# Patient Record
Sex: Male | Born: 2000 | Hispanic: Yes | Marital: Single | Smoking: Former smoker
Health system: Southern US, Community
[De-identification: ages and names within clinical notes are randomized; demographics above are authoritative.]

## PROBLEM LIST (undated history)

## (undated) ENCOUNTER — Emergency Department (HOSPITAL_COMMUNITY): Admission: EM | Payer: No Typology Code available for payment source | Source: Home / Self Care

## (undated) DIAGNOSIS — R399 Unspecified symptoms and signs involving the genitourinary system: Secondary | ICD-10-CM

## (undated) HISTORY — DX: Unspecified symptoms and signs involving the genitourinary system: R39.9

---
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
## 2018-08-12 ENCOUNTER — Ambulatory Visit (INDEPENDENT_AMBULATORY_CARE_PROVIDER_SITE_OTHER): Payer: Self-pay | Admitting: Family Medicine

## 2018-08-12 ENCOUNTER — Other Ambulatory Visit: Payer: Self-pay

## 2018-08-12 ENCOUNTER — Encounter: Payer: Self-pay | Admitting: Family Medicine

## 2018-08-12 VITALS — BP 112/68 | HR 64 | Temp 97.5°F | Wt 148.0 lb

## 2018-08-12 DIAGNOSIS — R399 Unspecified symptoms and signs involving the genitourinary system: Secondary | ICD-10-CM | POA: Insufficient documentation

## 2018-08-12 DIAGNOSIS — M545 Low back pain, unspecified: Secondary | ICD-10-CM | POA: Insufficient documentation

## 2018-08-12 HISTORY — DX: Unspecified symptoms and signs involving the genitourinary system: R39.9

## 2018-08-12 LAB — POCT URINALYSIS DIP (MANUAL ENTRY)
Bilirubin, UA: NEGATIVE
Blood, UA: NEGATIVE
Glucose, UA: NEGATIVE mg/dL
Ketones, POC UA: NEGATIVE mg/dL
LEUKOCYTES UA: NEGATIVE
NITRITE UA: NEGATIVE
Protein Ur, POC: NEGATIVE mg/dL
Spec Grav, UA: 1.02 (ref 1.010–1.025)
UROBILINOGEN UA: 0.2 U/dL
pH, UA: 6 (ref 5.0–8.0)

## 2018-08-12 MED ORDER — CYCLOBENZAPRINE HCL 5 MG PO TABS: 5.0000 mg | ORAL_TABLET | Freq: Every day | ORAL | 1 refills | Status: DC

## 2018-08-12 NOTE — Progress Notes (Signed)
   Subjective:    Patient ID: Glenn Mcdowell, male    DOB: 26-Jun-2001, 18 y.o.   MRN: [id number]   CC: Back pain  HPI: Since 1 week the patient has been having non-radiating achy lower back pain. He is concerned he might have a kidney problem or a UTI. The pain started in the morning about 1 week ago and has stayed constant. The pain is not improved or worsened with sitting, resting, standing - it's just constant. He says the pain is sometimes worse with forward flexion. He is a Education administrator. He denies any injuries to this part of his back. He's never had the pain before. He denies burning, pain, blood or changes in odor with urination. He denies radiation of the pain into his lower extremities or to his groin.   Smoking status reviewed: non-smoker  Review of Systems  Constitutional: Negative for chills and fever.  Gastrointestinal: Negative for abdominal pain, nausea and vomiting.  Genitourinary: Negative for dysuria, flank pain, frequency, hematuria and urgency.  Musculoskeletal: Positive for back pain (lower back, 1 week).   Objective:  BP 112/68   Pulse 64   Temp (!) 97.5 F (36.4 C) (Oral)   Wt 148 lb (67.1 kg)   SpO2 100%   Physical Exam Cardiovascular:     Rate and Rhythm: Normal rate and regular rhythm.  Pulmonary:     Effort: Pulmonary effort is normal.     Breath sounds: Normal breath sounds.  Abdominal:     General: Abdomen is flat.     Palpations: Abdomen is soft.     Tenderness: There is no abdominal tenderness. There is no right CVA tenderness, left CVA tenderness or rebound.  Musculoskeletal: Normal range of motion.        General: No swelling, tenderness or signs of injury.     Comments: Tense paravertebral muscles, tender to palpation, improved with gentle compression and soft tissue technique  Neurological:     General: No focal deficit present.     Coordination: Coordination normal.     Gait: Gait normal.     Deep Tendon Reflexes: Reflexes normal.     Assessment & Plan:   Acute midline low back pain without sciatica Lower back pain MSK in nature, no sign of neurological deficits, no radiating pain, most likely back spasms d/t physically demanding job.  1. Patient given back strengthening-exercises and stretches to improve back pain and range of motion. 2. Use cold or warm compresses on the back about 15 minutes three times daily as needed to improve acutely worsening pain.  3. Take ibuprofen, tylenol and/or Flexeril as needed to improve pain. 4. Patient prescribed Flexeril 5mg  qHS as needed, only given 10 tablets.   Return if symptoms worsen or fail to improve.   Dr. Peggyann Shoals [facility], PGY-1

## 2018-08-12 NOTE — Patient Instructions (Signed)
Thank you for coming in to see [place] today! Please see below to review our plan for today's visit:  1. Use back strengthening-exercises and stretches to improve your back pain and range of motion. 2. Use cold or warm compresses on the back about 15 minutes three times daily as needed to improve acutely worsening pain.  3. Take ibuprofen, tylenol and/or flexeril as needed to improve pain.  Please call the clinic at [phone] if your symptoms worsen or you have any concerns. It was our pleasure to serve you!     Dr. Peggyann Shoals [facility]   Dolor de espalda agudo en los adultos Acute Back Pain, Adult El dolor de espalda agudo es repentino y por lo general no dura mucho tiempo. Se debe generalmente a una lesin de los msculos y tejidos de la espalda. La lesin puede ser el resultado de:  Estiramiento en exceso o desgarro (esguince) de un msculo o ligamento. Los ligamentos son tejidos que [employer]. Levantar algo de forma incorrecta puede producir un esguince de espalda.  Desgaste (degeneracin) de los discos vertebrales. Los discos vertebrales son tejido Insurance claims handler que proporciona acolchonamiento a los huesos de la columna vertebral (vrtebras).  Movimientos de giro, como al practicar deportes o realizar trabajos de Cash.  Un golpe en la espalda.  Artritis. Es posible Producer, television/film/video un examen fsico, anlisis de laboratorio u otros estudios de diagnstico por imgenes para Veterinary surgeon causa del Engineer, mining. El dolor de espalda agudo generalmente desaparece con reposo y cuidados en la casa. Sigue estas indicaciones en tu casa: Control del dolor, el entumecimiento y la hinchazn  Toma los medicamentos de venta libre y los recetados solamente como se lo haya indicado el mdico.  El mdico puede recomendarle que se aplique hielo durante las primeras 24a 48horas despus del comienzo del Engineer, mining. Para hacer esto: ? Ponga el hielo en una bolsa  plstica. ? Coloque una [employer] piel y Copy. ? Coloque el hielo durante , 2 a 3veces por da.  Si se lo indican, aplique calor en la zona afectada con la frecuencia que le haya indicado el mdico. Use la fuente de calor que el mdico le recomiende, como una compresa de calor hmedo o una almohadilla trmica. ? Colquese una [employer] piel y la fuente de Airline pilot. ? Aplique calor durante 20 a . ? Retire la fuente de calor si la piel se pone de color rojo brillante. Esto es especialmente importante si no puede sentir dolor, calor o fro. Corre un mayor riesgo de sufrir quemaduras. Actividad   No permanezca en la cama. Hacer reposo en la cama por ms de 1 a 2 das puede demorar su recuperacin.  Mantenga una buena postura al sentarse y pararse. No se incline hacia adelante al sentarse ni se encorve al pararse. ? Si trabaja en un escritorio, sintese cerca de este para no tener que inclinarse. Mantenga el mentn hacia abajo. Mantenga el cuello hacia atrs y los codos flexionados en ngulo recto. La posicin de los brazos debe verse como la letra "L". ? Cuando conduzca, sintese elevado y cerca del volante. Agregue un apoyo para la espalda (lumbar) al asiento del automvil, si es necesario.  Realice caminatas cortas en superficies planas tan pronto como le sea posible. Trate de caminar un poco ms de Pharmacist, community.  No se siente, conduzca o permanezca de pie en un mismo lugar durante ms de 30 minutos seguidos. Pararse o sentarse durante  largos perodos de [employer]	[place].  No conduzca ni use maquinaria pesada mientras toma analgsicos recetados.  Use tcnicas apropiadas para levantar objetos. Cuando se inclina y Solicitor un [place], utilice posiciones que no sobrecarguen tanto la espalda: ? Flexione las rodillas. ? Mantenga la carga cerca del cuerpo. ? No gire el cuerpo.  Haga actividad fsica habitualmente como se lo haya indicado el  mdico. Hacer ejercicios ayuda a que la espalda sane ms rpido y [place] a Automotive engineer las lesiones de la espalda al [employer] msculos fuertes y flexibles.  Trabaje con un fisioterapeuta para crear un programa de ejercicios seguros, segn lo recomiende el mdico. Haga ejercicios como se lo haya indicado el fisioterapeuta. Estilo de vida  Mantenga un peso saludable. El sobrepeso sobrecarga la espalda y hace que resulte difcil tener una buena [place].  Evite actividades o situaciones que lo hagan sentirse ansioso o estresado. El estrs y la ansiedad aumentan la tensin muscular y pueden empeorar el dolor de espalda. Aprenda formas de [employer] ansiedad y [place], como a travs del ejercicio. Indicaciones generales  Duerma sobre un colchn firme en una posicin cmoda. Intente acostarse de costado, con las rodillas ligeramente flexionadas. Si se recuesta [employer], coloque una almohada debajo de las rodillas.  Siga el plan de tratamiento como se lo haya indicado el mdico. Puede incluir: ? Terapia cognitiva o conductual. ? Acupuntura o terapia de masajes. ? Yoga o meditacin. Comuncate con un mdico si:  Siente un dolor que no se alivia con reposo o medicamentos.  Siente mucho dolor que se extiende a las piernas o las nalgas.  El dolor no mejora luego de 2 semanas.  Siente dolor por la noche.  Pierde peso sin proponrselo.  Tiene fiebre o escalofros. Solicite ayuda inmediatamente si:  Tiene nuevos problemas para controlar la vejiga o los intestinos.  Siente debilidad o adormecimiento inusuales en los brazos o en las piernas.  Siente nuseas o vmitos.  Siente dolor abdominal.  Siente que va a desmayarse. Resumen  El dolor de espalda agudo es repentino y por lo general no dura mucho tiempo.  Use tcnicas apropiadas para levantar objetos. Cuando se inclina y levanta un [place], utilice posiciones que no sobrecarguen tanto la espalda.  Tome los medicamentos de venta  libre o recetados y aplique calor o hielo solamente como se lo haya indicado el mdico. Esta informacin no tiene Theme park manager el consejo del mdico. Asegrese de hacerle al mdico cualquier pregunta que tenga. Document Released: 06/18/2005 Document Revised: 01/24/2018 Document Reviewed: 04/04/2017 Elsevier Interactive Patient Education  2019 [employer].

## 2018-08-12 NOTE — Assessment & Plan Note (Addendum)
Lower back pain MSK in nature, no sign of neurological deficits, no radiating pain, most likely back spasms d/t physically demanding job.  1. Patient given back strengthening-exercises and stretches to improve back pain and range of motion. 2. Use cold or warm compresses on the back about 15 minutes three times daily as needed to improve acutely worsening pain.  3. Take ibuprofen, tylenol and/or Flexeril as needed to improve pain. 4. Patient prescribed Flexeril 5mg  qHS as needed, only given 10 tablets. 5. UA was taken to r/o UTI and was negative. Patient given results.

## 2019-07-09 ENCOUNTER — Emergency Department (HOSPITAL_COMMUNITY): Payer: No Typology Code available for payment source

## 2019-07-09 ENCOUNTER — Other Ambulatory Visit: Payer: Self-pay

## 2019-07-09 ENCOUNTER — Emergency Department (HOSPITAL_COMMUNITY)
Admission: EM | Admit: 2019-07-09 | Discharge: 2019-07-09 | Disposition: A | Discharge status: Home / Self Care | Payer: No Typology Code available for payment source | Attending: Emergency Medicine | Admitting: Emergency Medicine

## 2019-07-09 DIAGNOSIS — Z79899 Other long term (current) drug therapy: Secondary | ICD-10-CM | POA: Insufficient documentation

## 2019-07-09 DIAGNOSIS — M25562 Pain in left knee: Secondary | ICD-10-CM | POA: Diagnosis present

## 2019-07-09 DIAGNOSIS — Y93I9 Activity, other involving external motion: Secondary | ICD-10-CM | POA: Insufficient documentation

## 2019-07-09 DIAGNOSIS — Y999 Unspecified external cause status: Secondary | ICD-10-CM | POA: Diagnosis not present

## 2019-07-09 DIAGNOSIS — Y92410 Unspecified street and highway as the place of occurrence of the external cause: Secondary | ICD-10-CM | POA: Insufficient documentation

## 2019-07-09 DIAGNOSIS — M791 Myalgia, unspecified site: Secondary | ICD-10-CM | POA: Insufficient documentation

## 2019-07-09 DIAGNOSIS — Z87891 Personal history of nicotine dependence: Secondary | ICD-10-CM | POA: Diagnosis not present

## 2019-07-09 DIAGNOSIS — M7918 Myalgia, other site: Secondary | ICD-10-CM

## 2019-07-09 MED ORDER — CYCLOBENZAPRINE HCL 10 MG PO TABS: 10.0000 mg | ORAL_TABLET | Freq: Two times a day (BID) | ORAL | 0 refills | Status: AC | PRN

## 2019-07-09 MED ORDER — METHOCARBAMOL 500 MG PO TABS
500.0000 mg | ORAL_TABLET | Freq: Once | ORAL | Status: AC
  Administered 2019-07-09: 23:00:00 500 mg via ORAL
  Filled 2019-07-09: qty 1

## 2019-07-09 MED ORDER — IBUPROFEN 600 MG PO TABS: 600.0000 mg | ORAL_TABLET | Freq: Four times a day (QID) | ORAL | 0 refills | Status: AC | PRN

## 2019-07-09 MED ORDER — IBUPROFEN 200 MG PO TABS
600.0000 mg | ORAL_TABLET | Freq: Once | ORAL | Status: AC
  Administered 2019-07-09: 600 mg via ORAL
  Filled 2019-07-09: qty 3

## 2019-07-09 NOTE — ED Notes (Signed)
PA at bedside. Interpreter iPad at bedside as well.

## 2019-07-09 NOTE — Discharge Instructions (Signed)
Wear the immobilizer and use crutches until walking without them is comfortable. Ice to the knee to reduce swelling.   Follow up with Dr. Linna Caprice if your knee pain is not much better in 4-5 days. Take medications as prescribed.
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
## 2019-07-13 ENCOUNTER — Encounter (HOSPITAL_COMMUNITY): Payer: Self-pay

## 2019-07-13 ENCOUNTER — Other Ambulatory Visit: Payer: Self-pay

## 2019-07-13 DIAGNOSIS — M542 Cervicalgia: Secondary | ICD-10-CM | POA: Diagnosis present

## 2019-07-13 DIAGNOSIS — Z5321 Procedure and treatment not carried out due to patient leaving prior to being seen by health care provider: Secondary | ICD-10-CM | POA: Insufficient documentation
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
## 2019-07-14 ENCOUNTER — Emergency Department (HOSPITAL_COMMUNITY)
Admission: EM | Admit: 2019-07-14 | Discharge: 2019-07-14 | Disposition: A | Discharge status: Home / Self Care | Payer: Self-pay | Attending: Emergency Medicine | Admitting: Emergency Medicine

## 2019-07-14 NOTE — ED Notes (Signed)
Pt did not come when called for vitals. Pt not seen in the lobby.

## 2019-09-07 ENCOUNTER — Encounter: Payer: Self-pay | Admitting: Family Medicine

## 2019-09-07 ENCOUNTER — Other Ambulatory Visit: Payer: Self-pay

## 2019-09-07 ENCOUNTER — Other Ambulatory Visit (HOSPITAL_COMMUNITY)
Admission: RE | Admit: 2019-09-07 | Discharge: 2019-09-07 | Disposition: A | Discharge status: Home / Self Care | Payer: Medicaid Other | Source: Ambulatory Visit | Attending: Family Medicine | Admitting: Family Medicine

## 2019-09-07 ENCOUNTER — Ambulatory Visit (INDEPENDENT_AMBULATORY_CARE_PROVIDER_SITE_OTHER): Payer: Self-pay | Admitting: Family Medicine

## 2019-09-07 DIAGNOSIS — Z202 Contact with and (suspected) exposure to infections with a predominantly sexual mode of transmission: Secondary | ICD-10-CM | POA: Diagnosis not present

## 2019-09-07 DIAGNOSIS — A630 Anogenital (venereal) warts: Secondary | ICD-10-CM

## 2019-09-07 NOTE — Patient Instructions (Signed)
I will call with lab test results. Please put over the counter lamisil cream on your penis 2 x per day. You will get your first gardisil vaccine today. See [place] again in 6 months for the second Gardisil vaccine. The frozen areas should heal fine.  Let me know if it seems infected.

## 2019-09-08 ENCOUNTER — Encounter: Payer: Self-pay | Admitting: Family Medicine

## 2019-09-08 LAB — HIV ANTIBODY (ROUTINE TESTING W REFLEX): HIV Screen 4th Generation wRfx: NONREACTIVE

## 2019-09-08 LAB — URINE CYTOLOGY ANCILLARY ONLY
Chlamydia: NEGATIVE
Comment: NEGATIVE
Comment: NORMAL
Neisseria Gonorrhea: NEGATIVE

## 2019-09-08 LAB — RPR: RPR Ser Ql: NONREACTIVE

## 2019-09-08 NOTE — Progress Notes (Signed)
    SUBJECTIVE:   CHIEF COMPLAINT / HPI:   19 yo male with concerns for penile white spots.  Oddly, he requested his mother be the interpretor.  I warned him that I would be asking very personal questions.  He continued to want his mother as the interpretor.  He also wanted me to call results to his mother's phone.  C/O penile lesion.  Not painful.  They itch.  Began about 4 months ago.  Resolved after 2 weeks.  Then recurrent 2 months ago and have stayed.  No previous STD.  Last intercourse was 5 months ago. No dysuria, discharge or groin swelling.  No rash.      OBJECTIVE:   BP (!) 102/58   Pulse (!) 59   Wt 158 lb (71.7 kg)   SpO2 99%   Circumcized male.  4 firm small lesions consistent with venereal warts, 3 on shaft, one on glans..  No testicular tenderness.  No inquinal adenopathy.  Also has one excoriated area on shaft near glans.    ASSESSMENT/PLAN:   Venereal warts Consistent appearance.  Initially I thought he only had one HPV vaccine.  Nurse checked and he has received the full series.  Cryotherapy to warts.   OTC terbinafine to excoriated dry lesion on shaft.  Exposure to sexually transmitted disease (STD) Will check for other STDs     Moses Manners, MD [facility]

## 2019-09-08 NOTE — Assessment & Plan Note (Signed)
Will check for other STDs

## 2019-09-08 NOTE — Assessment & Plan Note (Signed)
Consistent appearance.  Initially I thought he only had one HPV vaccine.  Nurse checked and he has received the full series.  Cryotherapy to warts.   OTC terbinafine to excoriated dry lesion on shaft.

## 2019-09-10 ENCOUNTER — Telehealth: Payer: Self-pay | Admitting: *Deleted

## 2019-09-10 NOTE — Telephone Encounter (Signed)
Informed pt mom of results and she said doctor had already called and informed pt. Crystall Donaldson Zimmerman Rumple, CMA

## 2019-09-10 NOTE — Telephone Encounter (Signed)
-----   Message from Garnette Gunner, MD sent at 09/10/2019  9:05 AM EST ----- Patient labs are normal. Request staff contact patient to inform.

## 2021-02-11 ENCOUNTER — Other Ambulatory Visit: Payer: Self-pay

## 2021-02-11 ENCOUNTER — Emergency Department (HOSPITAL_COMMUNITY): Payer: No Typology Code available for payment source

## 2021-02-11 ENCOUNTER — Emergency Department (HOSPITAL_COMMUNITY)
Admission: EM | Admit: 2021-02-11 | Discharge: 2021-02-11 | Disposition: A | Discharge status: Home / Self Care | Payer: No Typology Code available for payment source | Attending: Emergency Medicine | Admitting: Emergency Medicine

## 2021-02-11 ENCOUNTER — Encounter (HOSPITAL_COMMUNITY): Payer: Self-pay | Admitting: Emergency Medicine

## 2021-02-11 DIAGNOSIS — M25512 Pain in left shoulder: Secondary | ICD-10-CM | POA: Insufficient documentation

## 2021-02-11 DIAGNOSIS — M79651 Pain in right thigh: Secondary | ICD-10-CM | POA: Insufficient documentation

## 2021-02-11 DIAGNOSIS — M79645 Pain in left finger(s): Secondary | ICD-10-CM | POA: Insufficient documentation

## 2021-02-11 LAB — I-STAT CHEM 8, ED
BUN: 8 mg/dL (ref 6–20)
Calcium, Ion: 1.2 mmol/L (ref 1.15–1.40)
Chloride: 102 mmol/L (ref 98–111)
Creatinine, Ser: 0.7 mg/dL (ref 0.61–1.24)
Glucose, Bld: 103 mg/dL — ABNORMAL HIGH (ref 70–99)
HCT: 41 % (ref 39.0–52.0)
Hemoglobin: 13.9 g/dL (ref 13.0–17.0)
Potassium: 3.8 mmol/L (ref 3.5–5.1)
Sodium: 141 mmol/L (ref 135–145)
TCO2: 26 mmol/L (ref 22–32)

## 2021-02-11 MED ORDER — METHOCARBAMOL 500 MG PO TABS: 500.0000 mg | ORAL_TABLET | Freq: Two times a day (BID) | ORAL | 0 refills | Status: AC
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
